# Patient Record
Sex: Male | Born: 1970 | Hispanic: Yes | Marital: Single
Health system: Southern US, Community
[De-identification: ages and names within clinical notes are randomized; demographics above are authoritative.]

---

## 2014-05-26 ENCOUNTER — Other Ambulatory Visit: Payer: Self-pay | Admitting: Cardiology

## 2014-05-26 DIAGNOSIS — R109 Unspecified abdominal pain: Secondary | ICD-10-CM

## 2014-05-27 ENCOUNTER — Ambulatory Visit
Admission: RE | Admit: 2014-05-27 | Discharge: 2014-05-27 | Disposition: A | Discharge status: Home / Self Care | Payer: No Typology Code available for payment source | Source: Ambulatory Visit | Attending: Cardiology | Admitting: Cardiology

## 2014-05-27 DIAGNOSIS — R109 Unspecified abdominal pain: Secondary | ICD-10-CM

## 2015-08-15 IMAGING — US US ABDOMEN COMPLETE
1 series · 14 of 25 positions shown · non-contrast
Comparison: None.

CLINICAL DATA: Abdominal pain.

EXAM:
ULTRASOUND ABDOMEN COMPLETE

[Series 1: us abdomen complete · 0.39mm/px · 14 of 94 slices shown]
[im 1/94]
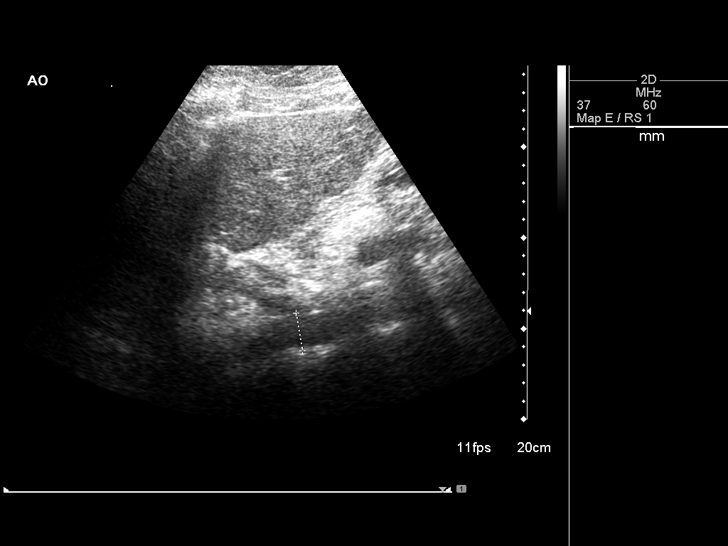
[im 8/94]
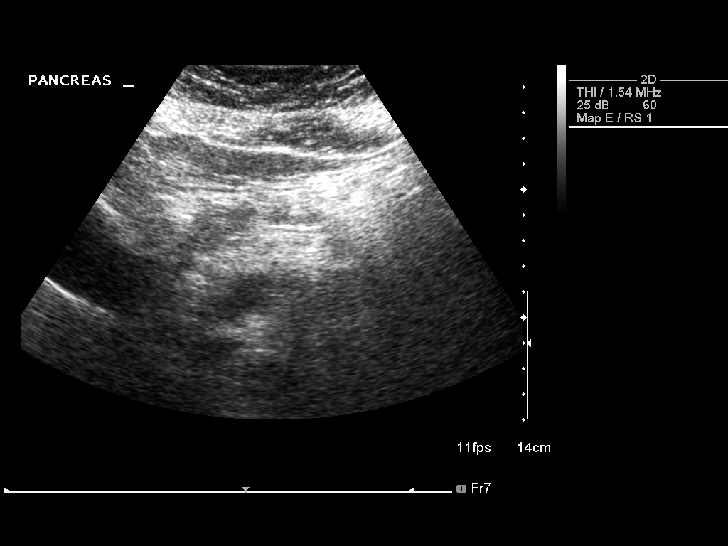
[im 16/94]
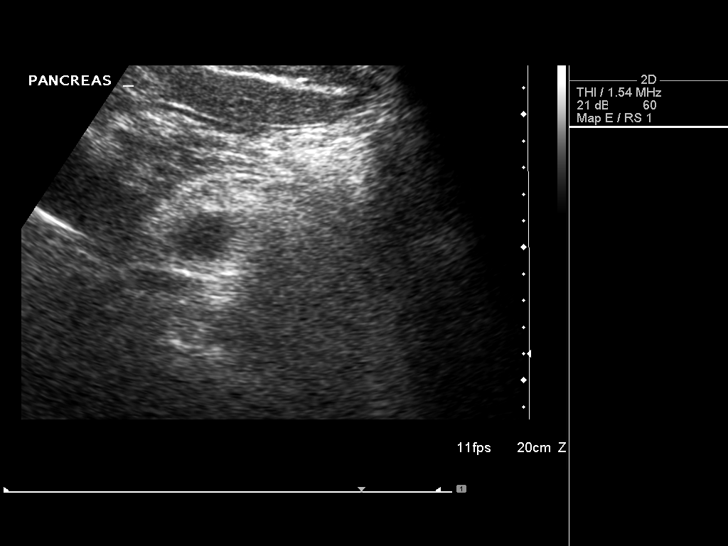
[im 24/94]
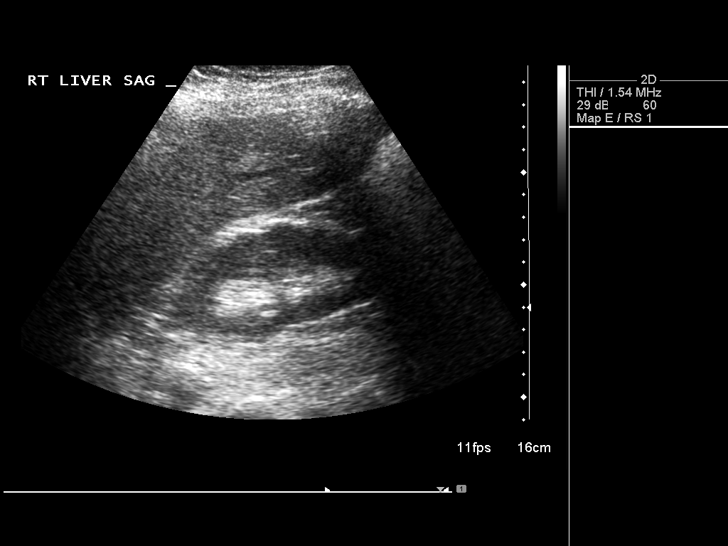
[im 32/94]
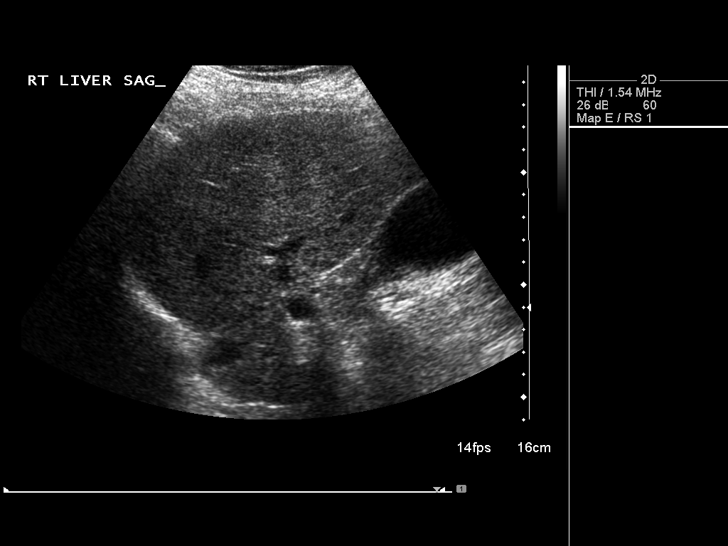
[im 35/94]
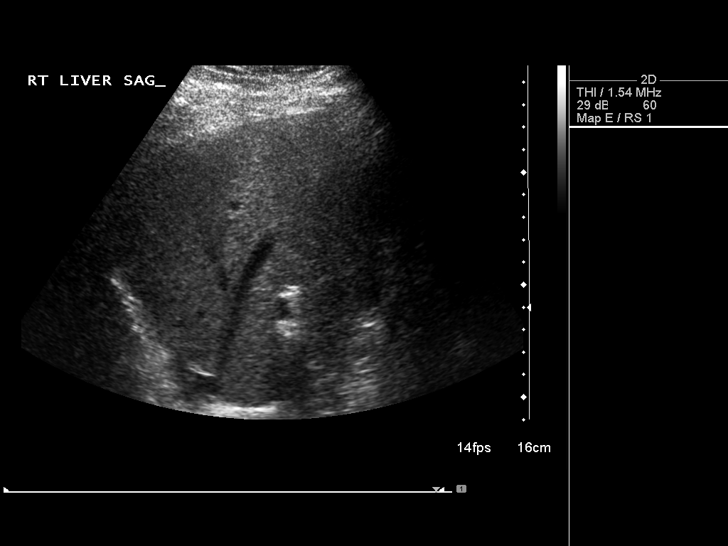
[im 43/94]
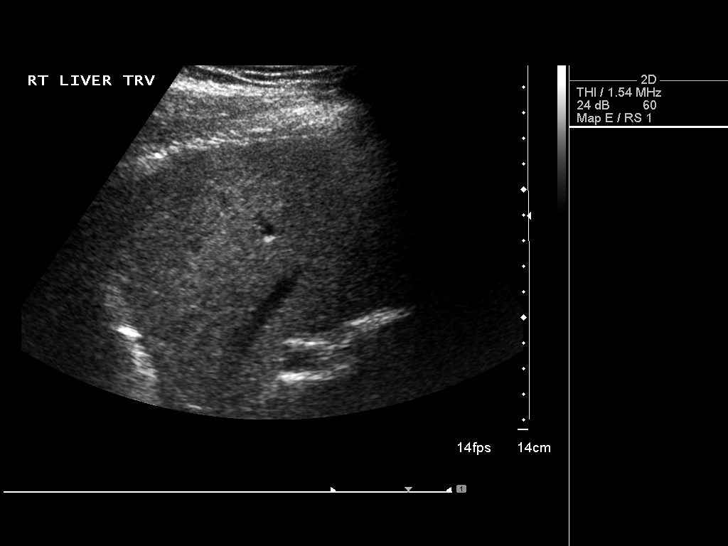
[im 51/94]
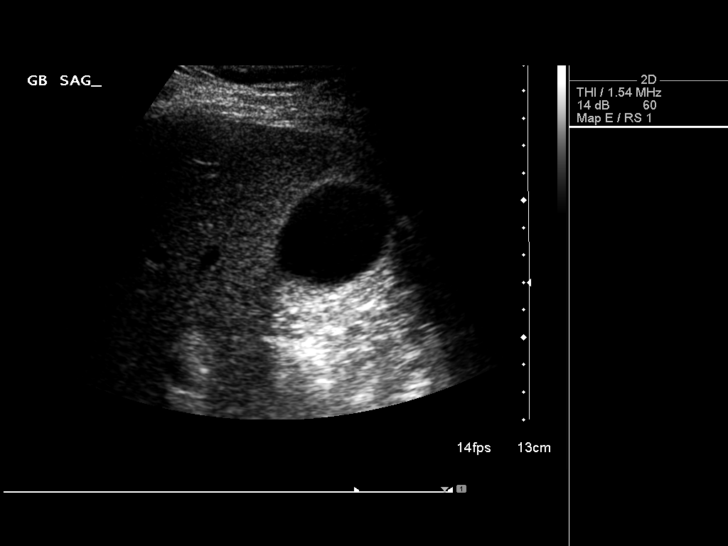
[im 59/94]
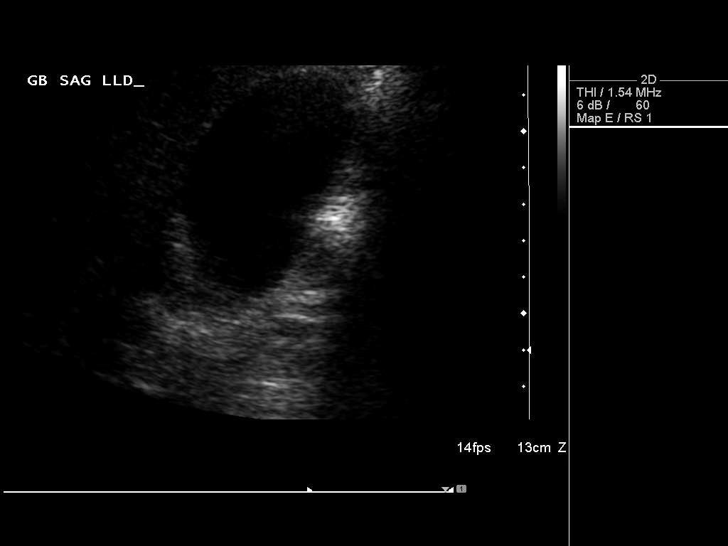
[im 63/94]
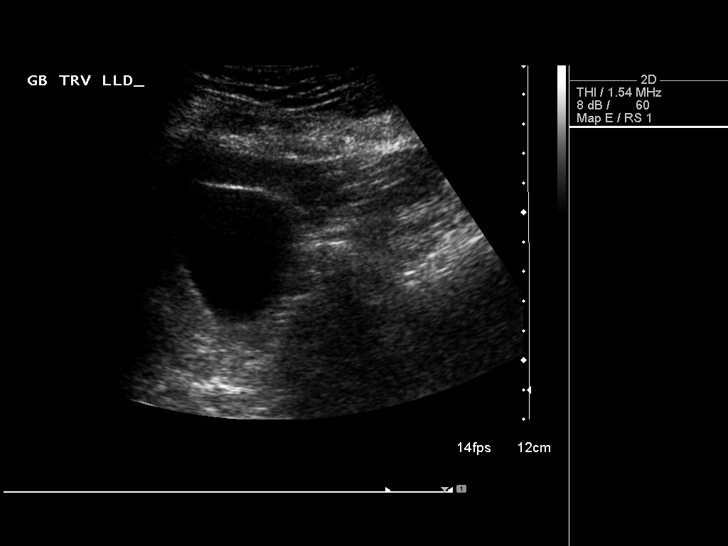
[im 70/94]
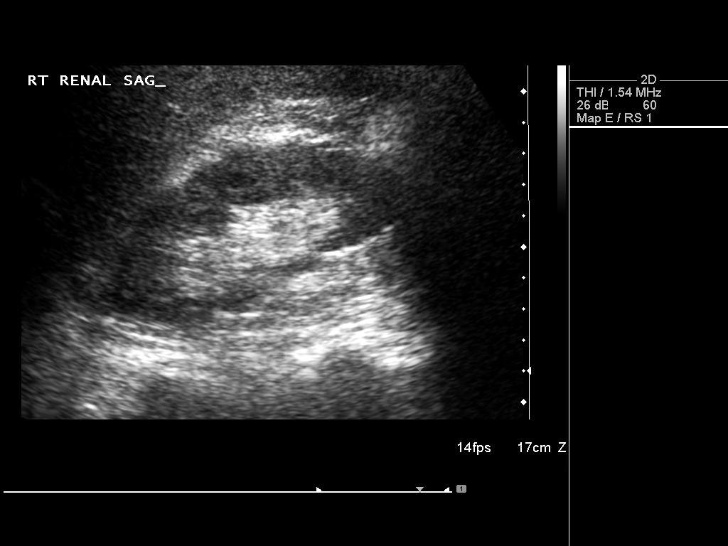
[im 78/94]
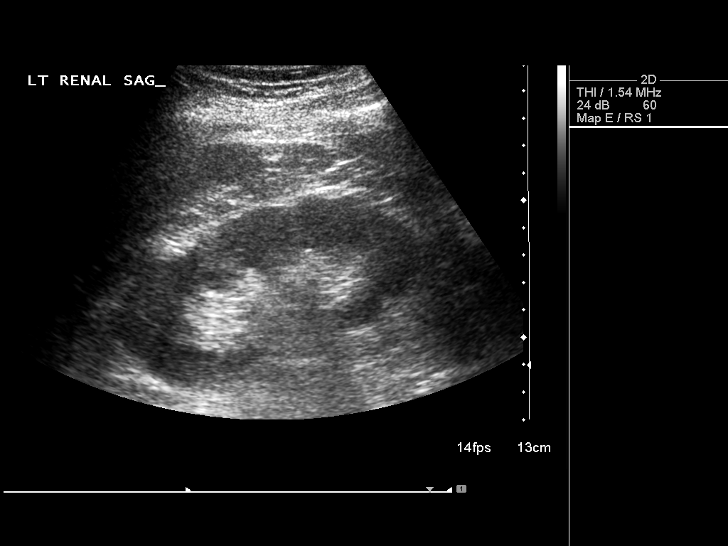
[im 86/94]
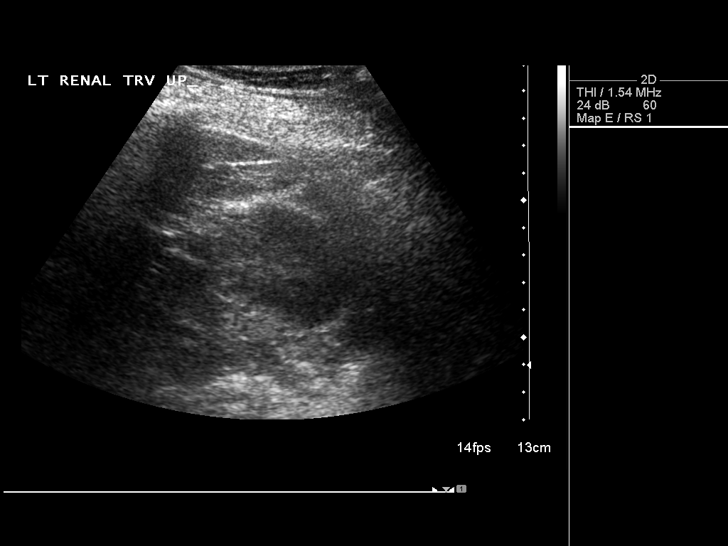
[im 94/94]
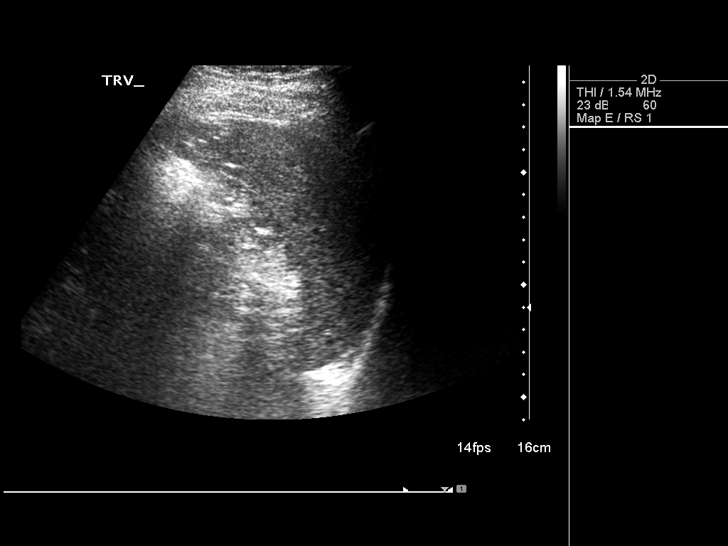

[14 of 25 positions shown; findings below may reference images not displayed]

FINDINGS: Gallbladder:

No gallstones or wall thickening visualized. No sonographic Murphy
sign noted.

Common bile duct:

Diameter: Normal caliber, 3 mm.

Liver:

No focal lesion identified. Within normal limits in parenchymal
echogenicity.

IVC:

No abnormality visualized.

Pancreas:

Visualized portion unremarkable.

Spleen:

Size and appearance within normal limits.

Right Kidney:

Length: 10.5 cm. Echogenicity within normal limits. No mass or
hydronephrosis visualized.

Left Kidney:

Length: 11.9 cm. Echogenicity within normal limits. No mass or
hydronephrosis visualized.

Abdominal aorta:

No aneurysm visualized.

Other findings:

None.
IMPRESSION: Unremarkable abdominal ultrasound.
